# Patient Record
Sex: Female | Born: 1950 | Race: White | Hispanic: No | Marital: Married
Health system: Southern US, Community
[De-identification: ages and names within clinical notes are randomized; demographics above are authoritative.]

---

## 1997-09-23 ENCOUNTER — Other Ambulatory Visit: Admission: RE | Admit: 1997-09-23 | Discharge: 1997-09-23 | Payer: Self-pay | Admitting: Obstetrics and Gynecology

## 1998-02-11 ENCOUNTER — Ambulatory Visit (HOSPITAL_COMMUNITY): Admission: RE | Admit: 1998-02-11 | Discharge: 1998-02-11 | Payer: Self-pay | Admitting: Obstetrics and Gynecology

## 1999-04-18 ENCOUNTER — Ambulatory Visit (HOSPITAL_COMMUNITY): Admission: RE | Admit: 1999-04-18 | Discharge: 1999-04-18 | Payer: Self-pay | Admitting: Obstetrics and Gynecology

## 1999-04-18 ENCOUNTER — Encounter: Payer: Self-pay | Admitting: Obstetrics and Gynecology

## 2000-04-19 ENCOUNTER — Ambulatory Visit (HOSPITAL_COMMUNITY): Admission: RE | Admit: 2000-04-19 | Discharge: 2000-04-19 | Payer: Self-pay | Admitting: Obstetrics and Gynecology

## 2000-04-19 ENCOUNTER — Encounter: Payer: Self-pay | Admitting: Obstetrics and Gynecology

## 2000-12-25 ENCOUNTER — Other Ambulatory Visit: Admission: RE | Admit: 2000-12-25 | Discharge: 2000-12-25 | Payer: Self-pay | Admitting: Obstetrics and Gynecology

## 2001-04-23 ENCOUNTER — Encounter: Payer: Self-pay | Admitting: Obstetrics and Gynecology

## 2001-04-23 ENCOUNTER — Ambulatory Visit (HOSPITAL_COMMUNITY): Admission: RE | Admit: 2001-04-23 | Discharge: 2001-04-23 | Payer: Self-pay | Admitting: Obstetrics and Gynecology

## 2002-03-02 ENCOUNTER — Encounter: Payer: Self-pay | Admitting: Family Medicine

## 2002-03-02 ENCOUNTER — Encounter: Admission: RE | Admit: 2002-03-02 | Discharge: 2002-03-02 | Payer: Self-pay | Admitting: Family Medicine

## 2002-04-28 ENCOUNTER — Other Ambulatory Visit: Admission: RE | Admit: 2002-04-28 | Discharge: 2002-04-28 | Payer: Self-pay | Admitting: Obstetrics and Gynecology

## 2003-08-19 ENCOUNTER — Other Ambulatory Visit: Admission: RE | Admit: 2003-08-19 | Discharge: 2003-08-19 | Payer: Self-pay | Admitting: Obstetrics and Gynecology

## 2003-08-25 ENCOUNTER — Encounter: Admission: RE | Admit: 2003-08-25 | Discharge: 2003-08-25 | Payer: Self-pay | Admitting: Obstetrics and Gynecology

## 2004-09-13 ENCOUNTER — Other Ambulatory Visit: Admission: RE | Admit: 2004-09-13 | Discharge: 2004-09-13 | Payer: Self-pay | Admitting: Obstetrics and Gynecology

## 2006-09-23 ENCOUNTER — Ambulatory Visit (HOSPITAL_COMMUNITY): Admission: RE | Admit: 2006-09-23 | Discharge: 2006-09-23 | Payer: Self-pay | Admitting: Obstetrics and Gynecology

## 2008-01-05 ENCOUNTER — Ambulatory Visit (HOSPITAL_COMMUNITY): Admission: RE | Admit: 2008-01-05 | Discharge: 2008-01-05 | Payer: Self-pay | Admitting: Obstetrics and Gynecology

## 2008-03-03 ENCOUNTER — Encounter: Admission: RE | Admit: 2008-03-03 | Discharge: 2008-03-03 | Payer: Self-pay | Admitting: Obstetrics and Gynecology

## 2010-05-03 ENCOUNTER — Encounter: Admission: RE | Admit: 2010-05-03 | Discharge: 2010-05-03 | Payer: Self-pay | Admitting: Obstetrics and Gynecology

## 2010-06-25 ENCOUNTER — Encounter: Payer: Self-pay | Admitting: Obstetrics and Gynecology

## 2010-12-05 ENCOUNTER — Other Ambulatory Visit: Payer: Self-pay | Admitting: Obstetrics and Gynecology

## 2010-12-05 DIAGNOSIS — N63 Unspecified lump in unspecified breast: Secondary | ICD-10-CM

## 2010-12-19 ENCOUNTER — Ambulatory Visit
Admission: RE | Admit: 2010-12-19 | Discharge: 2010-12-19 | Disposition: A | Discharge status: Home / Self Care | Payer: BC Managed Care – PPO | Source: Ambulatory Visit | Attending: Obstetrics and Gynecology | Admitting: Obstetrics and Gynecology

## 2010-12-19 DIAGNOSIS — N63 Unspecified lump in unspecified breast: Secondary | ICD-10-CM

## 2013-10-22 ENCOUNTER — Other Ambulatory Visit (HOSPITAL_COMMUNITY): Payer: Self-pay | Admitting: Obstetrics and Gynecology

## 2013-10-22 DIAGNOSIS — E041 Nontoxic single thyroid nodule: Secondary | ICD-10-CM

## 2013-10-28 ENCOUNTER — Ambulatory Visit (HOSPITAL_COMMUNITY)
Admission: RE | Admit: 2013-10-28 | Discharge: 2013-10-28 | Disposition: A | Discharge status: Home / Self Care | Payer: 59 | Source: Ambulatory Visit | Attending: Obstetrics and Gynecology | Admitting: Obstetrics and Gynecology

## 2013-10-28 DIAGNOSIS — E041 Nontoxic single thyroid nodule: Secondary | ICD-10-CM | POA: Insufficient documentation

## 2013-10-29 ENCOUNTER — Other Ambulatory Visit: Payer: Self-pay | Admitting: Obstetrics and Gynecology

## 2013-10-29 DIAGNOSIS — R928 Other abnormal and inconclusive findings on diagnostic imaging of breast: Secondary | ICD-10-CM

## 2013-11-10 ENCOUNTER — Ambulatory Visit
Admission: RE | Admit: 2013-11-10 | Discharge: 2013-11-10 | Disposition: A | Discharge status: Home / Self Care | Payer: 59 | Source: Ambulatory Visit | Attending: Obstetrics and Gynecology | Admitting: Obstetrics and Gynecology

## 2013-11-10 DIAGNOSIS — R928 Other abnormal and inconclusive findings on diagnostic imaging of breast: Secondary | ICD-10-CM

## 2014-07-16 IMAGING — MG MM DIAGNOSTIC UNILATERAL L
3 series · 3 of 3 positions shown · non-contrast
Comparison: Previous examinations

CLINICAL DATA: Recall from screening mammogram.

EXAM:
DIGITAL DIAGNOSTIC  left breast MAMMOGRAM WITH CAD

[L CC (1 of 2)]
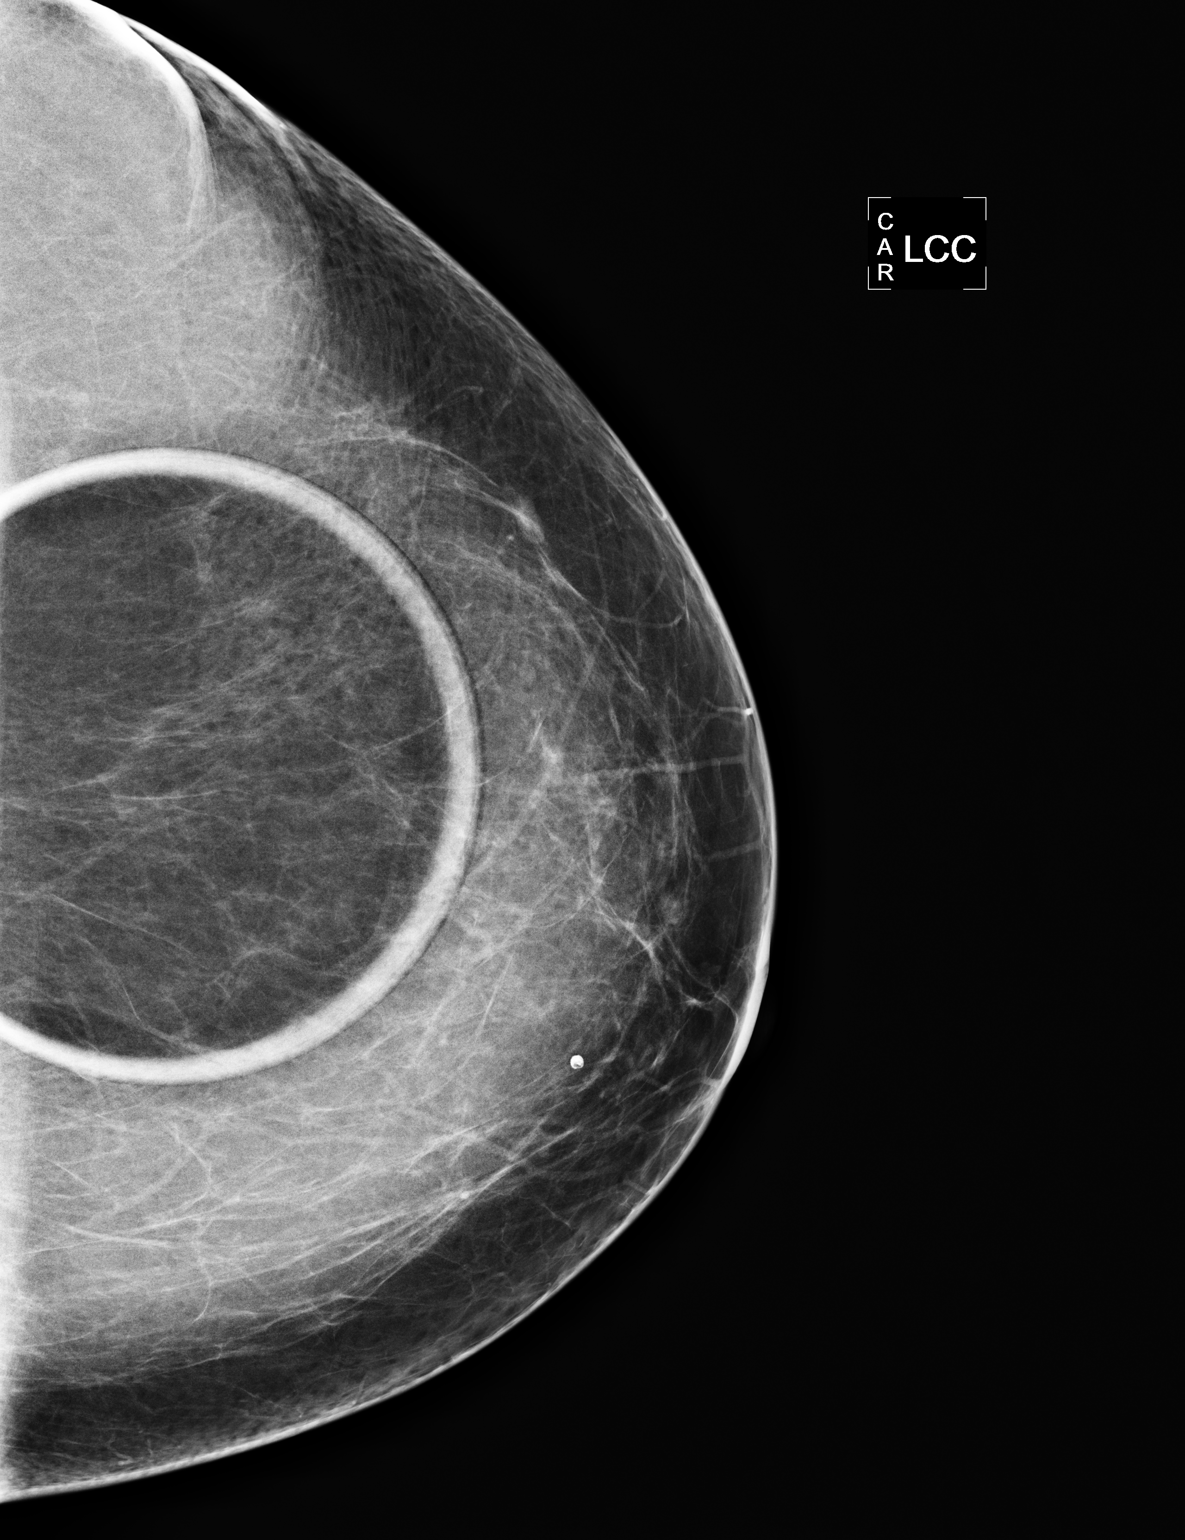

[L MLO]
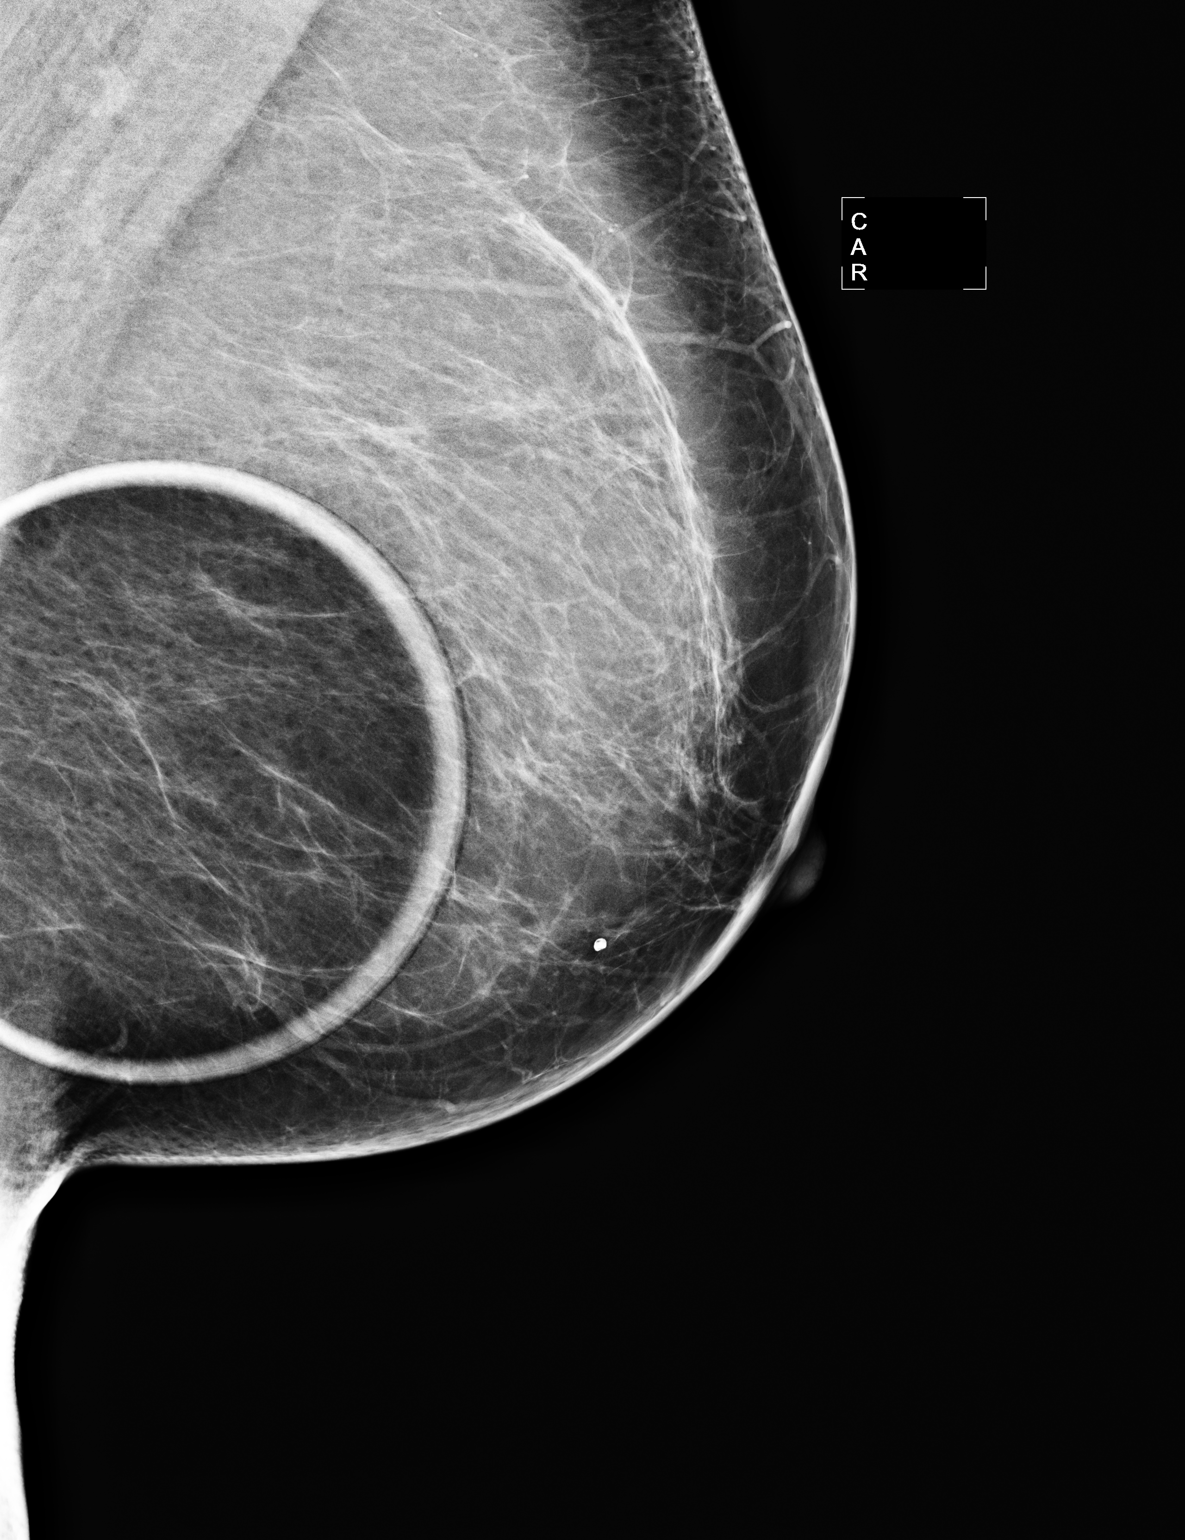

[L CC (2 of 2)]
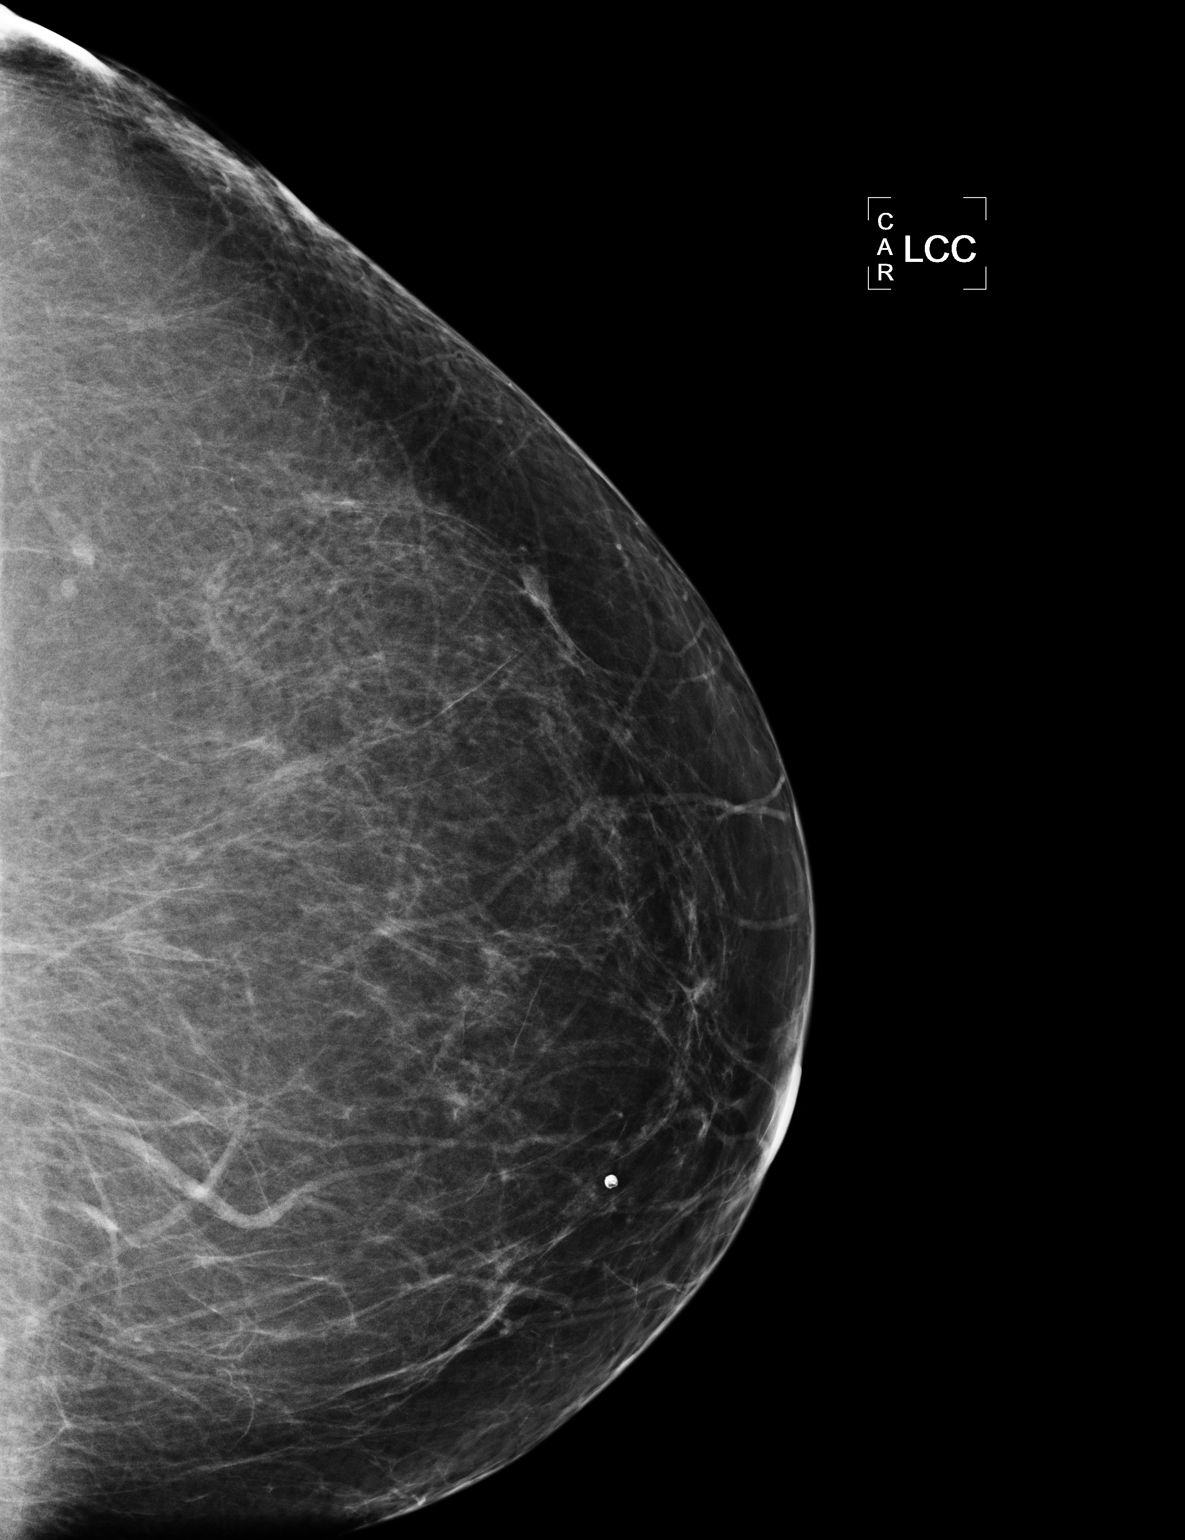

[3 of 3 positions shown; findings below may reference images not displayed]

ACR Breast Density Category b: There are scattered areas of
fibroglandular density.
FINDINGS: Additional views of the left breast demonstrate no persistent
abnormality. The appearance noted on the screening study is
consistent with a summation shadow.

Mammographic images were processed with CAD.
IMPRESSION: No persistent abnormality on additional evaluation of the left
breast.

RECOMMENDATION:
Screening mammography 1 year.

I have discussed the findings and recommendations with the patient.
Results were also provided in writing at the conclusion of the
visit. If applicable, a reminder letter will be sent to the patient
regarding the next appointment.

BI-RADS CATEGORY  1: Negative.

## 2022-05-08 ENCOUNTER — Other Ambulatory Visit: Payer: Self-pay | Admitting: Obstetrics and Gynecology

## 2022-05-08 DIAGNOSIS — Z8249 Family history of ischemic heart disease and other diseases of the circulatory system: Secondary | ICD-10-CM

## 2022-06-15 ENCOUNTER — Ambulatory Visit
Admission: RE | Admit: 2022-06-15 | Discharge: 2022-06-15 | Disposition: A | Discharge status: Home / Self Care | Payer: No Typology Code available for payment source | Source: Ambulatory Visit | Attending: Obstetrics and Gynecology | Admitting: Obstetrics and Gynecology

## 2022-06-15 DIAGNOSIS — Z8249 Family history of ischemic heart disease and other diseases of the circulatory system: Secondary | ICD-10-CM

## 2024-05-20 ENCOUNTER — Other Ambulatory Visit: Payer: Self-pay | Admitting: Obstetrics and Gynecology

## 2024-05-20 DIAGNOSIS — E041 Nontoxic single thyroid nodule: Secondary | ICD-10-CM

## 2024-06-03 ENCOUNTER — Ambulatory Visit
Admission: RE | Admit: 2024-06-03 | Discharge: 2024-06-03 | Disposition: A | Discharge status: Home / Self Care | Source: Ambulatory Visit | Attending: Obstetrics and Gynecology | Admitting: Obstetrics and Gynecology

## 2024-06-03 DIAGNOSIS — E041 Nontoxic single thyroid nodule: Secondary | ICD-10-CM
